# Patient Record
Sex: Female | Born: 2000 | Race: White | Hispanic: No | Marital: Single | Smoking: Never smoker
Health system: Southern US, Community
[De-identification: ages and names within clinical notes are randomized; demographics above are authoritative.]

## PROBLEM LIST (undated history)

## (undated) DIAGNOSIS — G90A Postural orthostatic tachycardia syndrome (POTS): Secondary | ICD-10-CM

## (undated) DIAGNOSIS — I498 Other specified cardiac arrhythmias: Secondary | ICD-10-CM

## (undated) HISTORY — DX: Postural orthostatic tachycardia syndrome (POTS): G90.A

## (undated) HISTORY — DX: Other specified cardiac arrhythmias: I49.8

---
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
## 2019-07-09 ENCOUNTER — Telehealth: Payer: BLUE CROSS/BLUE SHIELD | Admitting: Physician Assistant

## 2019-07-09 DIAGNOSIS — R059 Cough, unspecified: Secondary | ICD-10-CM

## 2019-07-09 DIAGNOSIS — R05 Cough: Secondary | ICD-10-CM

## 2019-07-09 NOTE — Progress Notes (Signed)
We are sorry that you are not feeling well.  Here is how we plan to help!  Based on your presentation I believe you most likely have A cough due to a virus.  This is called viral bronchitis and is best treated by rest, plenty of fluids and control of the cough.  You may use Ibuprofen or Tylenol as directed to help your symptoms.     In addition you may use A non-prescription cough medication called Mucinex DM: take 2 tablets every 12 hours.  Giving lack of exposure to coronavirus it is less likely that this is coronavirus and more likely a run of the mill viral infection. That being said if you have any concern for coronavirus you can proceed with testing. Testing hours, instructions through [facility] can be found at [email or web address]. Weekend testing can be done through [facility] or by contacting your local health department. For information about community testing sites, call [phone]. I would quarantine until symptoms resolve or if you proceed with testing, quarantine until results are in.   From your responses in the eVisit questionnaire you describe inflammation in the upper respiratory tract which is causing a significant cough.  This is commonly called Bronchitis and has four common causes:    Allergies  Viral Infections  Acid Reflux  Bacterial Infection Allergies, viruses and acid reflux are treated by controlling symptoms or eliminating the cause. An example might be a cough caused by taking certain blood pressure medications. You stop the cough by changing the medication. Another example might be a cough caused by acid reflux. Controlling the reflux helps control the cough.  USE OF BRONCHODILATOR ("RESCUE") INHALERS: There is a risk from using your bronchodilator too frequently.  The risk is that over-reliance on a medication which only relaxes the muscles surrounding the breathing tubes can reduce the effectiveness of medications prescribed to reduce swelling and congestion  of the tubes themselves.  Although you feel brief relief from the bronchodilator inhaler, your asthma may actually be worsening with the tubes becoming more swollen and filled with mucus.  This can delay other crucial treatments, such as oral steroid medications. If you need to use a bronchodilator inhaler daily, several times per day, you should discuss this with your provider.  There are probably better treatments that could be used to keep your asthma under control.     HOME CARE . Only take medications as instructed by your medical team. . Complete the entire course of an antibiotic. . Drink plenty of fluids and get plenty of rest. . Avoid close contacts especially the very young and the elderly . Cover your mouth if you cough or cough into your sleeve. . Always remember to wash your hands . A steam or ultrasonic humidifier can help congestion.   GET HELP RIGHT AWAY IF: . You develop worsening fever. . You become short of breath . You cough up blood. . Your symptoms persist after you have completed your treatment plan MAKE SURE YOU   Understand these instructions.  Will watch your condition.  Will get help right away if you are not doing well or get worse.  Your e-visit answers were reviewed by a board certified advanced clinical practitioner to complete your personal care plan.  Depending on the condition, your plan could have included both over the counter or prescription medications. If there is a problem please reply  once you have received a response from your provider. Your safety is important to us.    If you have drug allergies check your prescription carefully.    You can use MyChart to ask questions about today's visit, request a non-urgent call back, or ask for a work or school excuse for 24 hours related to this e-Visit. If it has been greater than 24 hours you will need to follow up with your provider, or enter a new e-Visit to address those concerns. You will get an  e-mail in the next two days asking about your experience.  I hope that your e-visit has been valuable and will speed your recovery. Thank you for using e-visits.   

## 2019-07-09 NOTE — Progress Notes (Signed)
I have spent 5 minutes in review of e-visit questionnaire, review and updating patient chart, medical decision making and response to patient.   Aleatha Taite Cody Beatrice Ziehm, PA-C    

## 2021-05-22 ENCOUNTER — Telehealth: Payer: Self-pay

## 2021-05-22 NOTE — Telephone Encounter (Signed)
NOTES ON FILE FROM [facility] [phone], SENT REFERRAL TO SCHEDULING

## 2021-08-06 ENCOUNTER — Institutional Professional Consult (permissible substitution): Payer: BLUE CROSS/BLUE SHIELD | Admitting: Internal Medicine

## 2021-08-15 ENCOUNTER — Other Ambulatory Visit: Payer: Self-pay

## 2021-08-15 ENCOUNTER — Ambulatory Visit (INDEPENDENT_AMBULATORY_CARE_PROVIDER_SITE_OTHER): Payer: BC Managed Care – PPO | Admitting: Cardiology

## 2021-08-15 ENCOUNTER — Encounter: Payer: Self-pay | Admitting: Cardiology

## 2021-08-15 VITALS — BP 104/64 | HR 89 | Ht 63.0 in | Wt 122.4 lb

## 2021-08-15 DIAGNOSIS — R002 Palpitations: Secondary | ICD-10-CM | POA: Diagnosis not present

## 2021-08-15 MED ORDER — DILTIAZEM HCL ER COATED BEADS 120 MG PO CP24: 120.0000 mg | ORAL_CAPSULE | Freq: Every day | ORAL | 11 refills | Status: AC

## 2021-08-15 NOTE — Progress Notes (Signed)
Electrophysiology Office Note   Date:  08/15/2021   ID:  Chelsea Conway, DOB 07/15/2001, MRN [id number]  PCP:  Stevphen Meuse, MD  Cardiologist:   Primary Electrophysiologist:  Capri Veals Jorja Loa, MD    Chief Complaint: palpitations   History of Present Illness: Chelsea Conway is a 19 y.o. female who is being seen today for the evaluation of palpitations at the request of Cardell Peach, April, MD. Presenting today for electrophysiology evaluation.  She presents today for work-up of her palpitations.  She had previously been diagnosed with POTS at [facility].  She is currently on Toprol-XL.  She is currently drinking 3 L of fluid along with increased salt intake.  Her initial symptoms were palpitations and feeling foggy.  She has intermittent lightheadedness as well as intermittent visual changes.  She has not had frank syncope.  She has had some blackout episodes.  She has intermittent nausea, vomiting, generalized fatigue.  She had orthostatic vitals done that showed an increase in her heart rate from 104 to 134 bpm but no hypotension.  Today, she denies symptoms of shortness of breath, orthopnea, PND, lower extremity edema, claudication, dizziness, presyncope, syncope, bleeding, or neurologic sequela. The patient is tolerating medications without difficulties.  She is continue to have the same symptoms over the last few months.  She ran out of her metoprolol, but she had not noticed a difference.  She does not feel that taking the fludrocortisone has changed her overall symptomatology.  She has noticed a minor change with increasing fluid and salt intake.  She continues to have palpitations, intermittent chest pain, nausea, and fatigue.   Past Medical History:  Diagnosis Date   POTS (postural orthostatic tachycardia syndrome)    History reviewed. No pertinent surgical history.   Current Outpatient Medications  Medication Sig Dispense Refill   diltiazem (CARDIZEM CD) 120 MG 24 hr capsule Take  1 capsule (120 mg total) by mouth daily. 30 capsule 11   fludrocortisone (FLORINEF) 0.1 MG tablet Take 200 mcg by mouth daily.     guanFACINE (INTUNIV) 1 MG TB24 ER tablet Take 1 tablet by mouth daily.     No current facility-administered medications for this visit.    Allergies:   Patient has no known allergies.   Social History:  The patient  reports that she has never smoked. She has never used smokeless tobacco. She reports that she does not drink alcohol and does not use drugs.   Family History:  The patient's family history includes Asthma in her father.    ROS:  Please see the history of present illness.   Otherwise, review of systems is positive for none.   All other systems are reviewed and negative.    PHYSICAL EXAM: VS:  BP 104/64   Pulse 89   Ht 5\' 3"  (1.6 m)   Wt 122 lb 6.4 oz (55.5 kg)   SpO2 98%   BMI 21.68 kg/m  , BMI Body mass index is 21.68 kg/m. GEN: Well nourished, well developed, in no acute distress  HEENT: normal  Neck: no JVD, carotid bruits, or masses Cardiac: RRR; no murmurs, rubs, or gallops,no edema  Respiratory:  clear to auscultation bilaterally, normal work of breathing GI: soft, nontender, nondistended, + BS MS: no deformity or atrophy  Skin: warm and dry Neuro:  Strength and sensation are intact Psych: euthymic mood, full affect  EKG:  EKG is ordered today. Personal review of the ekg ordered shows sinus rhythm, rate 89  Recent Labs: No results  found for requested labs within last 8760 hours.    Lipid Panel  No results found for: CHOL, TRIG, HDL, CHOLHDL, VLDL, LDLCALC, LDLDIRECT   Wt Readings from Last 3 Encounters:  08/15/21 122 lb 6.4 oz (55.5 kg)  06/23/12 109 lb 9.1 oz (49.7 kg) (82 %, Z= 0.92)*   * Growth percentiles are based on CDC (Girls, 2-20 Years) data.      Other studies Reviewed: Additional studies/ records that were reviewed today include: TTE 01/10/21  Review of the above records today demonstrates:  1. The EF  is estimated at 60-65%.  2. Normal left ventricular diastolic filling is observed.  3. There is mild mitral regurgitation observed.  4. There is mild tricuspid regurgitation.   Cardiac monitor 01/17/2021 Predominant rhythm is normal sinus rhythm with an average heart rate of 91 bpm, rates range from 51 to 172 bpm.  Significant tachycardia burden 39%.  Cannot completely exclude possibility of atrial tachycardia.  1 episode of second-degree Mobitz type I heart block noted, no associated symptoms reported.  Patient had 3 separate "patient events", once with sinus tachycardia only, once no associated arrhythmia and lastly a single PVC.   ASSESSMENT AND PLAN:  1.  Tachycardia/palpitations: Was thought likely due to POTS previously.  Is currently on Toprol-XL, fludrocortisone.  She is drinking high volumes of fluid, up to 3 L a day as well as increased salt.  She is felt somewhat improved with higher volumes of fluid, though she does continue to have a constellation of autonomic type symptoms.  She did not notice much of a change with metoprolol.  We Kahlie Deutscher start her on low-dose diltiazem 120 mg to see if this Tierra Divelbiss help curb her palpitations.  I have also spoken with her about increasing her exercise using a recumbent bike.    Current medicines are reviewed at length with the patient today.   The patient does not have concerns regarding her medicines.  The following changes were made today: Start diltiazem  Labs/ tests ordered today include:  Orders Placed This Encounter  Procedures   EKG 12-Lead      Disposition:   FU with Criag Wicklund 3 months  Signed, Eragon Hammond Jorja Loa, MD  08/15/2021 9:54 AM     [facility] [address] [place] [address] [phone] (office) [phone] (fax)

## 2021-08-15 NOTE — Patient Instructions (Addendum)
Medication Instructions:  Your physician has recommended you make the following change in your medication:  START Cardizem (Diltiazem) 120 mg once daily  *If you need a refill on your cardiac medications before your next appointment, please call your pharmacy*   Lab Work: None Ordered If you have labs (blood work) drawn today and your tests are completely normal, you will receive your results only by: MyChart Message (if you have MyChart) OR A paper copy in the mail If you have any lab test that is abnormal or we need to change your treatment, we will call you to review the results.   Testing/Procedures: None Ordered   Follow-Up: At [facility], you and your health needs are our priority.  As part of our continuing mission to provide you with exceptional heart care, we have created designated Provider Care Teams.  These Care Teams include your primary Cardiologist (physician) and Advanced Practice Providers (APPs -  Physician Assistants and Nurse Practitioners) who all work together to provide you with the care you need, when you need it.   Your next appointment:   3 month(s) on Friday Jan. 6 at 9:30 am  The format for your next appointment:   In Person  Provider:   You may see Loman Brooklyn, MD or one of the following Advanced Practice Providers on your designated Care Team:   Francis Dowse, [place] "[facility]" [place], [place]

## 2021-08-29 NOTE — Telephone Encounter (Signed)
Pt returned my call Aware I will mail POTs booklet and placard next week. She will send letter when she gets it together. She appreciates my follow up

## 2021-08-29 NOTE — Telephone Encounter (Signed)
Attempted to call pt to discuss verbally but she reports that she can't talk right now as her cat is having a medical emergency and she needs to call the vet. Pt aware I will reach back out...Marland KitchenMarland Kitchen

## 2021-09-17 ENCOUNTER — Telehealth: Payer: Self-pay | Admitting: Cardiology

## 2021-09-17 NOTE — Telephone Encounter (Signed)
Pt sent in a my chart message today that was forwarded to Dr. Elberta Fortis to address.  I called pt and informed her of this information.

## 2021-09-17 NOTE — Telephone Encounter (Signed)
Patient states that she got her accessibility placard today in the mail, however, the part that is used to let her know if the placard is "Permanent or Temporary" was not filled out. She feels like she would need it marked permanent but does not feel comfortable making that decision on her own. She is aware Sherri, RN is off today and advised she may not hear back until tomorrow. Please advise.

## 2021-10-02 ENCOUNTER — Telehealth: Payer: Self-pay | Admitting: Cardiology

## 2021-10-02 DIAGNOSIS — G90A Postural orthostatic tachycardia syndrome (POTS): Secondary | ICD-10-CM

## 2021-10-02 NOTE — Telephone Encounter (Signed)
Pt is following up on a few questions she had spoken with Dr. Elberta Fortis RN about on 09/24/21, pt states she was advised  someone will reach back  out to her and she have not received a return call

## 2021-10-03 NOTE — Telephone Encounter (Signed)
Called pt to discuss Pt advised that she needs to follow  up with PCP about mobility issues/cane and not being related to POTS. Aware that can hold/stop Diltiazem to see if cause vs helping at all. Advised to f/u w/ PCP on "coat hanger pain" she refers to, also not related to POTS.  Aware we are going to refer her to MD @ [facility] that can see her about her POTS and have a more local contact for POTS.  She is completely agreeable   Will formulate letter for her DRO at school (she has sent the information that needs to be included in the letter).  She will send me a  Clinical cytogeneticist message with fax number to fax letter to.

## 2021-10-15 ENCOUNTER — Other Ambulatory Visit: Payer: Self-pay | Admitting: *Deleted

## 2021-10-16 ENCOUNTER — Encounter: Payer: Self-pay | Admitting: *Deleted

## 2021-12-20 ENCOUNTER — Ambulatory Visit: Payer: BLUE CROSS/BLUE SHIELD | Admitting: Cardiology

## 2022-03-06 ENCOUNTER — Encounter: Payer: Self-pay | Admitting: Cardiology

## 2022-03-13 NOTE — Telephone Encounter (Signed)
Pt aware Dr. Elberta Fortis ok with renewing parking placard. ?Aware he will be back in the office this coming Monday to sign.   ?She appreciates our help with this ?

## 2022-03-18 ENCOUNTER — Telehealth: Payer: Self-pay | Admitting: Cardiology

## 2022-03-18 NOTE — Telephone Encounter (Signed)
Aware Dr. Curt Bears will sign tomorrow and I will call her when it is ready. ?Made aware it is my fault he did not sign it yesterday and apologized. ?Pt understood and fine with this plan. ?

## 2022-03-18 NOTE — Telephone Encounter (Signed)
? ?  Pt is calling back to f/u her parking placard ?

## 2022-03-20 NOTE — Telephone Encounter (Signed)
Pt calling to get update on parking placard. Pt states that she needs this done by tomorrow morning. Please advise ?

## 2022-03-20 NOTE — Telephone Encounter (Signed)
Pt informed placard parking sheet signed. ?She will stop by office today before 6pm to pick it up. ?

## 2022-03-20 NOTE — Telephone Encounter (Signed)
Patient is calling to check on status of form.  ?

## 2022-04-17 ENCOUNTER — Ambulatory Visit: Payer: BC Managed Care – PPO | Admitting: Cardiology

## 2022-04-17 NOTE — Progress Notes (Deleted)
Electrophysiology Office Note   Date:  04/17/2022   ID:  Chelsea Conway, DOB Apr 25, 2001, MRN [id number]  PCP:  Stevphen Meuse, MD  Cardiologist:   Primary Electrophysiologist:  Billie Trager Jorja Loa, MD    Chief Complaint: palpitations   History of Present Illness: Chelsea Conway is a 21 y.o. female who is being seen today for the evaluation of palpitations at the request of Cardell Peach, April, MD. Presenting today for electrophysiology evaluation.  She presents to discuss palpitations.  She was diagnosed with POTS at [facility].  She has been drinking 3 L of fluid a day as well as increasing her salt intake.  Her symptoms include palpitations and feeling foggy.  She has intermittent lightheadedness as well as intermittent visual changes.  She has not had frank syncope.  She has had some blackout episodes.  She has nausea vomiting and generalized fatigue.  Orthostatic vitals showed tachycardia when standing but no hypotension.  Today, denies symptoms of palpitations, chest pain, shortness of breath, orthopnea, PND, lower extremity edema, claudication, dizziness, presyncope, syncope, bleeding, or neurologic sequela. The patient is tolerating medications without difficulties. ***    Past Medical History:  Diagnosis Date   POTS (postural orthostatic tachycardia syndrome)    No past surgical history on file.   Current Outpatient Medications  Medication Sig Dispense Refill   diltiazem (CARDIZEM CD) 120 MG 24 hr capsule Take 1 capsule (120 mg total) by mouth daily. 30 capsule 11   fludrocortisone (FLORINEF) 0.1 MG tablet Take 200 mcg by mouth daily.     guanFACINE (INTUNIV) 1 MG TB24 ER tablet Take 1 tablet by mouth daily.     No current facility-administered medications for this visit.    Allergies:   Patient has no known allergies.   Social History:  The patient  reports that she has never smoked. She has never used smokeless tobacco. She reports that she does not drink alcohol and does  not use drugs.   Family History:  The patient's family history includes Asthma in her father.   ROS:  Please see the history of present illness.   Otherwise, review of systems is positive for none.   All other systems are reviewed and negative.   PHYSICAL EXAM: VS:  There were no vitals taken for this visit. , BMI There is no height or weight on file to calculate BMI. GEN: Well nourished, well developed, in no acute distress  HEENT: normal  Neck: no JVD, carotid bruits, or masses Cardiac: ***RRR; no murmurs, rubs, or gallops,no edema  Respiratory:  clear to auscultation bilaterally, normal work of breathing GI: soft, nontender, nondistended, + BS MS: no deformity or atrophy  Skin: warm and dry Neuro:  Strength and sensation are intact Psych: euthymic mood, full affect  EKG:  EKG {ACTION; IS/IS TOI:[id number]} ordered today. Personal review of the ekg ordered *** shows ***   Recent Labs: No results found for requested labs within last 8760 hours.    Lipid Panel  No results found for: CHOL, TRIG, HDL, CHOLHDL, VLDL, LDLCALC, LDLDIRECT   Wt Readings from Last 3 Encounters:  08/15/21 122 lb 6.4 oz (55.5 kg)  06/23/12 109 lb 9.1 oz (49.7 kg) (82 %, Z= 0.92)*   * Growth percentiles are based on CDC (Girls, 2-20 Years) data.      Other studies Reviewed: Additional studies/ records that were reviewed today include: TTE 01/10/21  Review of the above records today demonstrates:  1. The EF is estimated at 60-65%.  2. Normal  left ventricular diastolic filling is observed.  3. There is mild mitral regurgitation observed.  4. There is mild tricuspid regurgitation.   Cardiac monitor 01/17/2021 Predominant rhythm is normal sinus rhythm with an average heart rate of 91 bpm, rates range from 51 to 172 bpm.  Significant tachycardia burden 39%.  Cannot completely exclude possibility of atrial tachycardia.  1 episode of second-degree Mobitz type I heart block noted, no associated symptoms  reported.  Patient had 3 separate "patient events", once with sinus tachycardia only, once no associated arrhythmia and lastly a single PVC.   ASSESSMENT AND PLAN:  1.  Tachycardia/palpitations: Thought likely due to POTS previously.  Is currently on Toprol-XL and fludrocortisone.  She is doing high volumes of fluid, up to 3 L a day as well as increasing her salt intake.***  Current medicines are reviewed at length with the patient today.   The patient does not have concerns regarding her medicines.  The following changes were made today: ***  Labs/ tests ordered today include:  No orders of the defined types were placed in this encounter.     Disposition:   FU with Kareen Jefferys *** months  Signed, Natalin Bible Jorja Loa, MD  04/17/2022 8:34 AM     [facility] [address] [place] [address] [phone] (office) [phone] (fax)
